# Patient Record
Sex: Female | Born: 1955 | Race: White | Hispanic: No | Marital: Single | Smoking: Former smoker
Health system: Southern US, Community
[De-identification: ages and names within clinical notes are randomized; demographics above are authoritative.]

## PROBLEM LIST (undated history)

## (undated) HISTORY — PX: HAND SURGERY: SHX662

---

## 2014-07-10 ENCOUNTER — Encounter (HOSPITAL_COMMUNITY): Payer: Self-pay | Admitting: Emergency Medicine

## 2014-07-10 ENCOUNTER — Emergency Department (HOSPITAL_COMMUNITY): Payer: BC Managed Care – PPO

## 2014-07-10 ENCOUNTER — Inpatient Hospital Stay (HOSPITAL_COMMUNITY)
Admission: EM | Admit: 2014-07-10 | Discharge: 2014-07-13 | DRG: 156 | Disposition: A | Discharge status: Home / Self Care | Payer: BC Managed Care – PPO | Attending: Otolaryngology | Admitting: Otolaryngology

## 2014-07-10 DIAGNOSIS — K113 Abscess of salivary gland: Secondary | ICD-10-CM

## 2014-07-10 DIAGNOSIS — K1121 Acute sialoadenitis: Secondary | ICD-10-CM | POA: Diagnosis present

## 2014-07-10 DIAGNOSIS — K1120 Sialoadenitis, unspecified: Principal | ICD-10-CM | POA: Diagnosis present

## 2014-07-10 DIAGNOSIS — Z87891 Personal history of nicotine dependence: Secondary | ICD-10-CM

## 2014-07-10 DIAGNOSIS — E860 Dehydration: Secondary | ICD-10-CM | POA: Diagnosis present

## 2014-07-10 LAB — BASIC METABOLIC PANEL
ANION GAP: 19 — AB (ref 5–15)
BUN: 17 mg/dL (ref 6–23)
CO2: 18 meq/L — AB (ref 19–32)
Calcium: 9.6 mg/dL (ref 8.4–10.5)
Chloride: 108 mEq/L (ref 96–112)
Creatinine, Ser: 1.03 mg/dL (ref 0.50–1.10)
GFR, EST AFRICAN AMERICAN: 68 mL/min — AB (ref >90)
GFR, EST NON AFRICAN AMERICAN: 59 mL/min — AB (ref >90)
Glucose, Bld: 109 mg/dL — ABNORMAL HIGH (ref 70–99)
Potassium: 4.2 mEq/L (ref 3.7–5.3)
SODIUM: 145 meq/L (ref 137–147)

## 2014-07-10 LAB — CBC WITH DIFFERENTIAL/PLATELET
BASOS ABS: 0 10*3/uL (ref 0.0–0.1)
Basophils Relative: 0 % (ref 0–1)
EOS PCT: 1 % (ref 0–5)
Eosinophils Absolute: 0.1 10*3/uL (ref 0.0–0.7)
HEMATOCRIT: 43.9 % (ref 36.0–46.0)
Hemoglobin: 14.5 g/dL (ref 12.0–15.0)
LYMPHS PCT: 11 % — AB (ref 12–46)
Lymphs Abs: 1.5 10*3/uL (ref 0.7–4.0)
MCH: 31.9 pg (ref 26.0–34.0)
MCHC: 33 g/dL (ref 30.0–36.0)
MCV: 96.7 fL (ref 78.0–100.0)
Monocytes Absolute: 1.1 10*3/uL — ABNORMAL HIGH (ref 0.1–1.0)
Monocytes Relative: 8 % (ref 3–12)
NEUTROS ABS: 11 10*3/uL — AB (ref 1.7–7.7)
Neutrophils Relative %: 80 % — ABNORMAL HIGH (ref 43–77)
PLATELETS: 307 10*3/uL (ref 150–400)
RBC: 4.54 MIL/uL (ref 3.87–5.11)
RDW: 14.2 % (ref 11.5–15.5)
WBC: 13.6 10*3/uL — AB (ref 4.0–10.5)

## 2014-07-10 MED ORDER — MORPHINE SULFATE 4 MG/ML IJ SOLN
4.0000 mg | Freq: Once | INTRAMUSCULAR | Status: AC
  Administered 2014-07-10: 4 mg via INTRAVENOUS
  Filled 2014-07-10: qty 1

## 2014-07-10 MED ORDER — SODIUM CHLORIDE 0.9 % IV BOLUS (SEPSIS)
1000.0000 mL | Freq: Once | INTRAVENOUS | Status: AC
  Administered 2014-07-10: 1000 mL via INTRAVENOUS

## 2014-07-10 MED ORDER — PROMETHAZINE HCL 25 MG PO TABS
12.5000 mg | ORAL_TABLET | Freq: Four times a day (QID) | ORAL | Status: DC | PRN
  Administered 2014-07-10 – 2014-07-13 (×9): 12.5 mg via ORAL
  Filled 2014-07-10 (×10): qty 1

## 2014-07-10 MED ORDER — CLINDAMYCIN PHOSPHATE 600 MG/50ML IV SOLN
600.0000 mg | Freq: Once | INTRAVENOUS | Status: AC
  Administered 2014-07-10: 600 mg via INTRAVENOUS
  Filled 2014-07-10: qty 50

## 2014-07-10 MED ORDER — CLINDAMYCIN PHOSPHATE 600 MG/50ML IV SOLN
600.0000 mg | Freq: Four times a day (QID) | INTRAVENOUS | Status: DC
  Administered 2014-07-10 – 2014-07-13 (×12): 600 mg via INTRAVENOUS
  Filled 2014-07-10 (×14): qty 50

## 2014-07-10 MED ORDER — IOHEXOL 300 MG/ML  SOLN
75.0000 mL | Freq: Once | INTRAMUSCULAR | Status: AC | PRN
  Administered 2014-07-10: 75 mL via INTRAVENOUS

## 2014-07-10 MED ORDER — POTASSIUM CHLORIDE IN NACL 20-0.45 MEQ/L-% IV SOLN
INTRAVENOUS | Status: DC
  Administered 2014-07-10 – 2014-07-13 (×4): via INTRAVENOUS
  Filled 2014-07-10 (×8): qty 1000

## 2014-07-10 MED ORDER — MORPHINE SULFATE 2 MG/ML IJ SOLN
2.0000 mg | INTRAMUSCULAR | Status: DC | PRN
  Administered 2014-07-10 – 2014-07-13 (×17): 2 mg via INTRAVENOUS
  Filled 2014-07-10 (×19): qty 1

## 2014-07-10 MED ORDER — HYDROCODONE-ACETAMINOPHEN 5-325 MG PO TABS
1.0000 | ORAL_TABLET | ORAL | Status: DC | PRN
  Administered 2014-07-10 – 2014-07-13 (×11): 2 via ORAL
  Filled 2014-07-10 (×11): qty 2

## 2014-07-10 NOTE — H&P (Signed)
Shelby Brock is an 58 y.o. female.   Chief Complaint: Parotid swelling HPI: Left parotid swelling started Tuesday evening. On Wednesday, she saw Dr. Wilburn Cornelia who started her on Augmentin. The swelling and pain have been fluctuating it got much worse this morning. She has not had fever. It has not fluctuated with meals. She has been staying well hydrated and has been taking her antibiotics.  History reviewed. No pertinent past medical history.  Past Surgical History  Procedure Laterality Date  . Hand surgery      History reviewed. No pertinent family history. Social History:  reports that she has quit smoking. She does not have any smokeless tobacco history on file. She reports that she drinks alcohol. She reports that she does not use illicit drugs.  Allergies: No Known Allergies   (Not in a hospital admission)  Results for orders placed during the hospital encounter of 07/10/14 (from the past 48 hour(s))  BASIC METABOLIC PANEL     Status: Abnormal   Collection Time    07/10/14  5:05 AM      Result Value Ref Range   Sodium 145  137 - 147 mEq/L   Potassium 4.2  3.7 - 5.3 mEq/L   Chloride 108  96 - 112 mEq/L   CO2 18 (*) 19 - 32 mEq/L   Glucose, Bld 109 (*) 70 - 99 mg/dL   BUN 17  6 - 23 mg/dL   Creatinine, Ser 1.03  0.50 - 1.10 mg/dL   Calcium 9.6  8.4 - 10.5 mg/dL   GFR calc non Af Amer 59 (*) >90 mL/min   GFR calc Af Amer 68 (*) >90 mL/min   Comment: (NOTE)     The eGFR has been calculated using the CKD EPI equation.     This calculation has not been validated in all clinical situations.     eGFR's persistently <90 mL/min signify possible Chronic Kidney     Disease.   Anion gap 19 (*) 5 - 15  CBC WITH DIFFERENTIAL     Status: Abnormal   Collection Time    07/10/14  5:05 AM      Result Value Ref Range   WBC 13.6 (*) 4.0 - 10.5 K/uL   RBC 4.54  3.87 - 5.11 MIL/uL   Hemoglobin 14.5  12.0 - 15.0 g/dL   HCT 43.9  36.0 - 46.0 %   MCV 96.7  78.0 - 100.0 fL   MCH 31.9   26.0 - 34.0 pg   MCHC 33.0  30.0 - 36.0 g/dL   RDW 14.2  11.5 - 15.5 %   Platelets 307  150 - 400 K/uL   Neutrophils Relative % 80 (*) 43 - 77 %   Neutro Abs 11.0 (*) 1.7 - 7.7 K/uL   Lymphocytes Relative 11 (*) 12 - 46 %   Lymphs Abs 1.5  0.7 - 4.0 K/uL   Monocytes Relative 8  3 - 12 %   Monocytes Absolute 1.1 (*) 0.1 - 1.0 K/uL   Eosinophils Relative 1  0 - 5 %   Eosinophils Absolute 0.1  0.0 - 0.7 K/uL   Basophils Relative 0  0 - 1 %   Basophils Absolute 0.0  0.0 - 0.1 K/uL   Ct Soft Tissue Neck W Contrast  07/10/2014   CLINICAL DATA:  Left jaw and neck swelling for 4 days.  EXAM: CT NECK WITH CONTRAST  TECHNIQUE: Multidetector CT imaging of the neck was performed using the standard protocol following  the bolus administration of intravenous contrast.  CONTRAST:  13mL OMNIPAQUE IOHEXOL 300 MG/ML  SOLN  COMPARISON:  None.  FINDINGS: The left parotid gland is enlarged, with a 1.9 x 2.2 x 3.3 cm (transverse by AP by CC) rim enhancing fluid collection within the superficial lobe with dependent enhancing versus dense contents. 2 mm sialolith along the inferior margin, axial 44/122. 3 mm left submandibular sialolith. Left parotid space inflammatory changes with trace effusion, slightly thickened left platysma. Remaining major salivary glands are unremarkable.  Aerodigestive tract is unremarkable. Normal appearance of the thyroid. Normal appearance of cervical vessels. No lymphadenopathy by CT size criteria.  Visualized paranasal sinuses and mastoid air cells are well aerated. Severe C4-5 and severe C6-7 disc degeneration with severe C6-7 neural foraminal narrowing, at least moderate to severe at C4-5.  IMPRESSION: 1.9 x 2.2 x 3.3 cm rim enhancing fluid collection in the left parotid gland, concerning for abscess including possibly a dilated duct and superimposed infection, with dependent purulent material versus enhancement. Findings less likely reflect necrotic mass. Associated 2 mm sialolith along the  inferior margin. Recommend close follow-up and, histopathologic correlation.  3 mm nonobstructing left submandibular sialolith.   Electronically Signed   By: Elon Alas   On: 07/10/2014 06:06    ROS: otherwise negative  Blood pressure 145/86, pulse 86, temperature 98 F (36.7 C), temperature source Oral, resp. rate 16, SpO2 100.00%.  PHYSICAL EXAM: Overall appearance:  Healthy appearing, in no distress, but in obvious discomfort. Head:  Normocephalic, atraumatic. Ears: External auditory ears are normal. Nose: External nose is healthy in appearance.  Oral Cavity/pharynx:  There are no mucosal lesions or masses identified. There is no swelling or tenderness along the buccal mucosa. Neuro:  No identifiable neurologic deficits. Facial nerve intact. Neck: No palpable neck masses, except for a swollen, indurated and somewhat tender left parotid gland. It is nonfluctuant.  Studies Reviewed: CT reviewed. Small fluid collection, possible early abscess. Tiny calculus but did not appear to be immediately adjacent to the infection.    Assessment/Plan Severe peritonitis, possibly  early abscess. Recommend admitted to the hospital for intravenous antibiotics and analgesics. Warm compresses, hydration. Monitor over the next 24-48 hours and consider surgical intervention if no improvement.  Shelby Brock 07/10/2014, 7:30 AM

## 2014-07-10 NOTE — ED Notes (Signed)
Pt to CT

## 2014-07-10 NOTE — ED Notes (Signed)
ENT MD at bedside.

## 2014-07-10 NOTE — ED Notes (Signed)
PA at bedside.

## 2014-07-10 NOTE — ED Notes (Signed)
Given warm blanket 

## 2014-07-10 NOTE — ED Notes (Signed)
Back from CT, alert, NAD, calm, "feeling better".

## 2014-07-10 NOTE — ED Provider Notes (Signed)
CSN: [id number]     Arrival date & time 07/10/14  0422 History   First MD Initiated Contact with Patient 07/10/14 [phone]     Chief Complaint  Patient presents with  . Facial Swelling      The history is provided by the patient.  Patient reports she has had left sided facial swelling for past 4 days Pain is worsening Worsened by palpation.  Nothing improves her pain No fever/vomiting.  No dental pain.  She reports it is hard to open her mouth.  No difficulty swallowing is reported She was seen by ENT Dr Annalee Genta who felt it was salivary gland stone, and was placed on augmentin and advised to suck on lemons Since she feels pain is worsened  PMH - none Past Surgical History  Procedure Laterality Date  . Hand surgery     History reviewed. No pertinent family history. History  Substance Use Topics  . Smoking status: Former Games developer  . Smokeless tobacco: Not on file  . Alcohol Use: Yes   OB History   Grav Para Term Preterm Abortions TAB SAB Ect Mult Living                 Review of Systems  Constitutional: Negative for fever.  HENT: Positive for facial swelling. Negative for trouble swallowing.   Respiratory: Negative for shortness of breath.   Cardiovascular: Negative for chest pain.  Neurological: Negative for weakness.  All other systems reviewed and are negative.     Allergies  Review of patient's allergies indicates no known allergies.  Home Medications   Prior to Admission medications   Medication Sig Start Date End Date Taking? Authorizing Provider  SUMAtriptan (IMITREX) 100 MG tablet Take 100 mg by mouth every 2 (two) hours as needed for migraine or headache. May repeat in 2 hours if headache persists or recurs.   Yes Historical Provider, MD  topiramate (TOPAMAX) 100 MG tablet Take 100 mg by mouth daily.   Yes Historical Provider, MD   BP 134/86  Pulse 103  Temp(Src) 98 F (36.7 C) (Oral)  Resp 16  SpO2 100% Physical Exam CONSTITUTIONAL: Well developed/well  nourished HEAD: Normocephalic/atraumatic EYES: EOMI/PERRL ENMT: Mucous membranes moist. Tenderness and swelling noted just below angle of left mandible.  No erythema or external abscess noted.  No dental tenderness or intra-oral abscess noted.  No significant trismus.  No stridor.  She is handling secretions well NECK: supple no meningeal signs CV: S1/S2 noted, no murmurs/rubs/gallops noted LUNGS: Lungs are clear to auscultation bilaterally, no apparent distress ABDOMEN: soft, nontender, no rebound or guarding NEURO: Pt is awake/alert, moves all extremitiesx4 EXTREMITIES: pulses normal, full ROM SKIN: warm, color normal PSYCH: no abnormalities of mood noted  ED Course  Procedures  5:31 AM Due to worsened pain will obtain CT imaging Pt agreeable 6:32 AM Discussed results with dr Pollyann Kennedy He gave patient option of operative management vs conservative management with pain meds/clindamycin Pt prefers to have operative management Dr Pollyann Kennedy to see patient in the ED Pt has been NPO while in ED.  Labs Review Labs Reviewed  BASIC METABOLIC PANEL - Abnormal; Notable for the following:    CO2 18 (*)    Glucose, Bld 109 (*)    GFR calc non Af Amer 59 (*)    GFR calc Af Amer 68 (*)    Anion gap 19 (*)    All other components within normal limits  CBC WITH DIFFERENTIAL - Abnormal; Notable for the following:  WBC 13.6 (*)    Neutrophils Relative % 80 (*)    Neutro Abs 11.0 (*)    Lymphocytes Relative 11 (*)    Monocytes Absolute 1.1 (*)    All other components within normal limits    Imaging Review Ct Soft Tissue Neck W Contrast  07/10/2014   CLINICAL DATA:  Left jaw and neck swelling for 4 days.  EXAM: CT NECK WITH CONTRAST  TECHNIQUE: Multidetector CT imaging of the neck was performed using the standard protocol following the bolus administration of intravenous contrast.  CONTRAST:  75mL OMNIPAQUE IOHEXOL 300 MG/ML  SOLN  COMPARISON:  None.  FINDINGS: The left parotid gland is enlarged,  with a 1.9 x 2.2 x 3.3 cm (transverse by AP by CC) rim enhancing fluid collection within the superficial lobe with dependent enhancing versus dense contents. 2 mm sialolith along the inferior margin, axial 44/122. 3 mm left submandibular sialolith. Left parotid space inflammatory changes with trace effusion, slightly thickened left platysma. Remaining major salivary glands are unremarkable.  Aerodigestive tract is unremarkable. Normal appearance of the thyroid. Normal appearance of cervical vessels. No lymphadenopathy by CT size criteria.  Visualized paranasal sinuses and mastoid air cells are well aerated. Severe C4-5 and severe C6-7 disc degeneration with severe C6-7 neural foraminal narrowing, at least moderate to severe at C4-5.  IMPRESSION: 1.9 x 2.2 x 3.3 cm rim enhancing fluid collection in the left parotid gland, concerning for abscess including possibly a dilated duct and superimposed infection, with dependent purulent material versus enhancement. Findings less likely reflect necrotic mass. Associated 2 mm sialolith along the inferior margin. Recommend close follow-up and, histopathologic correlation.  3 mm nonobstructing left submandibular sialolith.   Electronically Signed   By: Awilda Metroourtnay  Bloomer   On: 07/10/2014 06:06      MDM   Final diagnoses:  Abscess of parotid gland  Dehydration    Nursing notes including past medical history and social history reviewed and considered in documentation Labs/vital reviewed and considered     Joya Gaskinsonald W Kinzleigh Kandler, MD 07/10/14 [phone]

## 2014-07-10 NOTE — ED Notes (Signed)
Moderate swelling behind left jaw.  Saw Dr. Annalee GentaShoemaker and he prescribed pt. Augmenting and to stay really hydrated.  Suck on lemons to get salivary glands moving. ? Salivary gland stones.

## 2014-07-11 NOTE — Progress Notes (Signed)
Subjective: No significant improvement. She has been walking around and has been able to eat and drink.  Objective: Vital signs in last 24 hours: Temp:  [97.7 F (36.5 C)-98.9 F (37.2 C)] 98.9 F (37.2 C) (08/02 0510) Pulse Rate:  [75-87] 84 (08/02 0510) Resp:  [16-18] 17 (08/02 0510) BP: (123-150)/(74-88) 134/79 mmHg (08/02 0510) SpO2:  [95 %-100 %] 98 % (08/02 0510) Weight:  [165 lb (74.844 kg)] 165 lb (74.844 kg) (08/01 1200) Weight change:  Last BM Date: 07/11/14  Intake/Output from previous day: 08/01 0701 - 08/02 0700 In: 1492.5 [P.O.:360; I.V.:1132.5] Out: 1650 [Urine:1650] Intake/Output this shift:    PHYSICAL EXAM: No change in left parotid swelling. No skin erythema or fluctuance.  Lab Results:  Recent Labs  07/10/14 0505  WBC 13.6*  HGB 14.5  HCT 43.9  PLT 307   BMET  Recent Labs  07/10/14 0505  NA 145  K 4.2  CL 108  CO2 18*  GLUCOSE 109*  BUN 17  CREATININE 1.03  CALCIUM 9.6    Studies/Results: Ct Soft Tissue Neck W Contrast  07/10/2014   CLINICAL DATA:  Left jaw and neck swelling for 4 days.  EXAM: CT NECK WITH CONTRAST  TECHNIQUE: Multidetector CT imaging of the neck was performed using the standard protocol following the bolus administration of intravenous contrast.  CONTRAST:  75mL OMNIPAQUE IOHEXOL 300 MG/ML  SOLN  COMPARISON:  None.  FINDINGS: The left parotid gland is enlarged, with a 1.9 x 2.2 x 3.3 cm (transverse by AP by CC) rim enhancing fluid collection within the superficial lobe with dependent enhancing versus dense contents. 2 mm sialolith along the inferior margin, axial 44/122. 3 mm left submandibular sialolith. Left parotid space inflammatory changes with trace effusion, slightly thickened left platysma. Remaining major salivary glands are unremarkable.  Aerodigestive tract is unremarkable. Normal appearance of the thyroid. Normal appearance of cervical vessels. No lymphadenopathy by CT size criteria.  Visualized paranasal sinuses  and mastoid air cells are well aerated. Severe C4-5 and severe C6-7 disc degeneration with severe C6-7 neural foraminal narrowing, at least moderate to severe at C4-5.  IMPRESSION: 1.9 x 2.2 x 3.3 cm rim enhancing fluid collection in the left parotid gland, concerning for abscess including possibly a dilated duct and superimposed infection, with dependent purulent material versus enhancement. Findings less likely reflect necrotic mass. Associated 2 mm sialolith along the inferior margin. Recommend close follow-up and, histopathologic correlation.  3 mm nonobstructing left submandibular sialolith.   Electronically Signed   By: Awilda Metroourtnay  Bloomer   On: 07/10/2014 06:06    Medications: I have reviewed the patient's current medications.  Assessment/Plan: Stable, no worsening, no improvement. Allow one more day with IV antibiotics and then reevaluate for possible surgical drainage.  LOS: 1 day   Shelby Brock 07/11/2014, 9:50 AM

## 2014-07-12 ENCOUNTER — Inpatient Hospital Stay (HOSPITAL_COMMUNITY): Payer: BC Managed Care – PPO

## 2014-07-12 LAB — PROTIME-INR
INR: 1.08 (ref 0.00–1.49)
PROTHROMBIN TIME: 14 s (ref 11.6–15.2)

## 2014-07-12 MED ORDER — ONDANSETRON 8 MG/NS 50 ML IVPB
8.0000 mg | Freq: Three times a day (TID) | INTRAVENOUS | Status: DC | PRN
  Administered 2014-07-12: 8 mg via INTRAVENOUS
  Filled 2014-07-12: qty 8

## 2014-07-12 MED ORDER — MIDAZOLAM HCL 2 MG/2ML IJ SOLN
INTRAMUSCULAR | Status: AC | PRN
  Administered 2014-07-12: 1 mg via INTRAVENOUS

## 2014-07-12 MED ORDER — FENTANYL CITRATE 0.05 MG/ML IJ SOLN
INTRAMUSCULAR | Status: AC | PRN
  Administered 2014-07-12: 50 ug via INTRAVENOUS

## 2014-07-12 MED ORDER — MIDAZOLAM HCL 2 MG/2ML IJ SOLN
INTRAMUSCULAR | Status: AC
Start: 2014-07-12 — End: 2014-07-13
  Filled 2014-07-12: qty 2

## 2014-07-12 MED ORDER — FENTANYL CITRATE 0.05 MG/ML IJ SOLN
INTRAMUSCULAR | Status: AC
  Filled 2014-07-12: qty 2

## 2014-07-12 NOTE — Progress Notes (Signed)
Subjective: No better, no worse.  Objective: Vital signs in last 24 hours: Temp:  [98.2 F (36.8 C)-98.7 F (37.1 C)] 98.2 F (36.8 C) (08/03 0552) Pulse Rate:  [66-86] 66 (08/03 0552) Resp:  [16-17] 17 (08/03 0552) BP: (131-146)/(73-84) 131/73 mmHg (08/03 0552) SpO2:  [99 %-100 %] 100 % (08/03 0552) Weight change:  Last BM Date: 07/11/14  Intake/Output from previous day: 08/02 0701 - 08/03 0700 In: 1165 [P.O.:240; I.V.:675; IV Piggyback:250] Out: 1500 [Urine:1500] Intake/Output this shift:    PHYSICAL EXAM: Left parotid firm, tender, no significant change.  Lab Results:  Recent Labs  07/10/14 0505  WBC 13.6*  HGB 14.5  HCT 43.9  PLT 307   BMET  Recent Labs  07/10/14 0505  NA 145  K 4.2  CL 108  CO2 18*  GLUCOSE 109*  BUN 17  CREATININE 1.03  CALCIUM 9.6    Studies/Results: No results found.  Medications: I have reviewed the patient's current medications.  Assessment/Plan: Parotitis, possibly abscess. No improvement on IV antibiotics. In going to see if radiology to perform a needle aspirate using ultrasound guidance, to identify if there is pus, for sample for culture, and for decompression purposes. We are trying very hard to avoid an external scar, and potential facial nerve injury.  LOS: 2 days   Michall Noffke 07/12/2014, 9:25 AM

## 2014-07-12 NOTE — Procedures (Signed)
Interventional Radiology Procedure Note  Procedure: US guided aspiration LEFT parotid fluid Complications: None Recommendations: - Cultures pending  Signed,  Sterling BigHeath K. Omran Keelin, MD Vascular & Interventional Radiologist [facility]

## 2014-07-12 NOTE — Progress Notes (Signed)
Patient ID: Shelby Brock, female   DOB: 04-22-1956, 58 y.o.   MRN: [id number] Request received from ENT for US guided aspiration of left parotid fluid collection/? abscess in pt. Pt noted swelling/pain of left parotid last Tuesday and has been on oral antibiotics without sig change. CT showed 1.9 x 2.2 x 3.3 cm rim enhancing fluid collection in the left parotid gland, concerning for abscess including possibly a dilated  duct and superimposed infection, with dependent purulent material versus enhancement. Findings less likely reflect necrotic mass. Associated 2 mm sialolith along the inferior margin. Images were reviewed by Dr. Archer Asa. Exam: pt awake/alert; chest- CTA bilat; heart- RRR; abd- soft,+BS,NT; ext-FROM. Firm,fluctuant/indurated, tender left parotid gland.   Filed Vitals:   07/11/14 0510 07/11/14 1356 07/11/14 2207 07/12/14 0552  BP: 134/79 146/77 140/84 131/73  Pulse: 84 86 85 66  Temp: 98.9 F (37.2 C) 98.7 F (37.1 C) 98.5 F (36.9 C) 98.2 F (36.8 C)  TempSrc:  Oral Oral Oral  Resp: 17 17 16 17   Height:      Weight:      SpO2: 98% 99% 100% 100%   History reviewed. No pertinent past medical history. Past Surgical History  Procedure Laterality Date  . Hand surgery     Ct Soft Tissue Neck W Contrast  07/10/2014   CLINICAL DATA:  Left jaw and neck swelling for 4 days.  EXAM: CT NECK WITH CONTRAST  TECHNIQUE: Multidetector CT imaging of the neck was performed using the standard protocol following the bolus administration of intravenous contrast.  CONTRAST:  75mL OMNIPAQUE IOHEXOL 300 MG/ML  SOLN  COMPARISON:  None.  FINDINGS: The left parotid gland is enlarged, with a 1.9 x 2.2 x 3.3 cm (transverse by AP by CC) rim enhancing fluid collection within the superficial lobe with dependent enhancing versus dense contents. 2 mm sialolith along the inferior margin, axial 44/122. 3 mm left submandibular sialolith. Left parotid space inflammatory changes with trace effusion, slightly  thickened left platysma. Remaining major salivary glands are unremarkable.  Aerodigestive tract is unremarkable. Normal appearance of the thyroid. Normal appearance of cervical vessels. No lymphadenopathy by CT size criteria.  Visualized paranasal sinuses and mastoid air cells are well aerated. Severe C4-5 and severe C6-7 disc degeneration with severe C6-7 neural foraminal narrowing, at least moderate to severe at C4-5.  IMPRESSION: 1.9 x 2.2 x 3.3 cm rim enhancing fluid collection in the left parotid gland, concerning for abscess including possibly a dilated duct and superimposed infection, with dependent purulent material versus enhancement. Findings less likely reflect necrotic mass. Associated 2 mm sialolith along the inferior margin. Recommend close follow-up and, histopathologic correlation.  3 mm nonobstructing left submandibular sialolith.   Electronically Signed   By: Awilda Metro   On: 07/10/2014 06:06  Results for orders placed during the hospital encounter of 07/10/14  BASIC METABOLIC PANEL      Result Value Ref Range   Sodium 145  137 - 147 mEq/L   Potassium 4.2  3.7 - 5.3 mEq/L   Chloride 108  96 - 112 mEq/L   CO2 18 (*) 19 - 32 mEq/L   Glucose, Bld 109 (*) 70 - 99 mg/dL   BUN 17  6 - 23 mg/dL   Creatinine, Ser 4.09  0.50 - 1.10 mg/dL   Calcium 9.6  8.4 - 81.1 mg/dL   GFR calc non Af Amer 59 (*) >90 mL/min   GFR calc Af Amer 68 (*) >90 mL/min   Anion gap 19 (*)  5 - 15  CBC WITH DIFFERENTIAL      Result Value Ref Range   WBC 13.6 (*) 4.0 - 10.5 K/uL   RBC 4.54  3.87 - 5.11 MIL/uL   Hemoglobin 14.5  12.0 - 15.0 g/dL   HCT 62.943.9  52.836.0 - 41.346.0 %   MCV 96.7  78.0 - 100.0 fL   MCH 31.9  26.0 - 34.0 pg   MCHC 33.0  30.0 - 36.0 g/dL   RDW 24.414.2  01.011.5 - 27.215.5 %   Platelets 307  150 - 400 K/uL   Neutrophils Relative % 80 (*) 43 - 77 %   Neutro Abs 11.0 (*) 1.7 - 7.7 K/uL   Lymphocytes Relative 11 (*) 12 - 46 %   Lymphs Abs 1.5  0.7 - 4.0 K/uL   Monocytes Relative 8  3 - 12 %    Monocytes Absolute 1.1 (*) 0.1 - 1.0 K/uL   Eosinophils Relative 1  0 - 5 %   Eosinophils Absolute 0.1  0.0 - 0.7 K/uL   Basophils Relative 0  0 - 1 %   Basophils Absolute 0.0  0.0 - 0.1 K/uL   A/P: Pt with left parotitis/possible abscess, leukocytosis. Plan is for US guided left parotid gland fluid collection aspiration today. Details/risks of procedure d/w pt with her understanding and consent.

## 2014-07-13 MED ORDER — HYDROCODONE-ACETAMINOPHEN 7.5-325 MG PO TABS: 1.0000 | ORAL_TABLET | Freq: Four times a day (QID) | ORAL | Status: AC | PRN

## 2014-07-13 MED ORDER — CLINDAMYCIN HCL 300 MG PO CAPS: 300.0000 mg | ORAL_CAPSULE | Freq: Three times a day (TID) | ORAL | Status: AC

## 2014-07-13 MED ORDER — PROMETHAZINE HCL 25 MG RE SUPP: 25.0000 mg | Freq: Four times a day (QID) | RECTAL | Status: AC | PRN

## 2014-07-13 NOTE — Discharge Summary (Signed)
Physician Discharge Summary  Patient ID: Shelby Brock MRN: [id number] DOB/AGE: 59/12/1955 59 y.o.  Admit date: 07/10/2014 Discharge date: 07/13/2014  Admission Diagnoses: Parotid sialadenitis  Discharge Diagnoses:  Active Problems:   Parotitis, acute   Discharged Condition: good  Hospital Course: Minor improvement after IR aspiration.  Consults: Interventional radiology  Significant Diagnostic Studies: none  Treatments: procedures: Aspiration of parotid fluid by interventional radiology  Discharge Exam: Blood pressure 130/84, pulse 73, temperature 98.3 F (36.8 C), temperature source Oral, resp. rate 18, height 5\' 7"  (1.702 m), weight 165 lb (74.844 kg), SpO2 100.00%. PHYSICAL EXAM: Swelling left parotid, no fluctuance.  Disposition: Final discharge disposition not confirmed     Medication List         clindamycin 300 MG capsule  Commonly known as:  CLEOCIN  Take 1 capsule (300 mg total) by mouth 3 (three) times daily.     HYDROcodone-acetaminophen 7.5-325 MG per tablet  Commonly known as:  NORCO  Take 1 tablet by mouth every 6 (six) hours as needed for moderate pain.     promethazine 25 MG suppository  Commonly known as:  PHENERGAN  Place 1 suppository (25 mg total) rectally every 6 (six) hours as needed for nausea or vomiting.     SUMAtriptan 100 MG tablet  Commonly known as:  IMITREX  Take 100 mg by mouth every 2 (two) hours as needed for migraine or headache. May repeat in 2 hours if headache persists or recurs.     topiramate 100 MG tablet  Commonly known as:  TOPAMAX  Take 100 mg by mouth daily.           Follow-up Information   Follow up with Serena ColonelOSEN, Tracey Stewart, MD. Call on 07/15/2014.   Specialty:  Otolaryngology   Contact information:   [address] [place] [address] [phone]       Signed: Serena ColonelROSEN, Dayne Dekay 07/13/2014, 9:00 AM

## 2014-07-13 NOTE — Progress Notes (Signed)
Subjective: Feeling a little better since the aspiration was performed.  Objective: Vital signs in last 24 hours: Temp:  [98.1 F (36.7 C)-98.3 F (36.8 C)] 98.3 F (36.8 C) (08/04 0458) Pulse Rate:  [73-91] 73 (08/04 0458) Resp:  [17-18] 18 (08/04 0458) BP: (124-140)/(71-84) 130/84 mmHg (08/04 0458) SpO2:  [99 %-100 %] 100 % (08/04 0458) Weight change:  Last BM Date: 07/10/14  Intake/Output from previous day: 08/03 0701 - 08/04 0700 In: 3228.8 [P.O.:240; I.V.:2988.8] Out: 3 [Urine:3] Intake/Output this shift:    PHYSICAL EXAM: Induration and swelling with minor improvement.  Lab Results: No results found for this basename: WBC, HGB, HCT, PLT,  in the last 72 hours BMET No results found for this basename: NA, K, CL, CO2, GLUCOSE, BUN, CREATININE, CALCIUM,  in the last 72 hours  Studies/Results: [place] Aspiration  07/12/2014   Malachy MoanHeath McCullough, MD     07/12/2014  2:27 PM Interventional Radiology Procedure Note  Procedure: US guided aspiration LEFT parotid fluid Complications: None Recommendations: - Cultures pending  Signed,  Sterling BigHeath K. McCullough, MD Vascular & Interventional Radiologist [facility]     Medications: I have reviewed the patient's current medications.  Assessment/Plan: Slight improvement. Cultures negative to date. She seems to be well enough to go home and continue on oral clindamycin. We'll have her followup in the office in 2 days or sooner if she gets any worse.  LOS: 3 days   Leocadio Heal 07/13/2014, 8:57 AM

## 2014-07-13 NOTE — Discharge Summary (Signed)
Patient given discharge paperwork. Reviewed medications and prescriptions given to patient. Patient is familiar with MyChart, access given. Patient has no questions and is ready to be discharged.

## 2014-07-16 LAB — CULTURE, ROUTINE-ABSCESS: Culture: NO GROWTH

## 2015-06-01 IMAGING — CT CT NECK W/ CM
4 of 5 series · 15 of 33 positions shown, 17 images · IV contrast (Omni 300)
Comparison: None.

CLINICAL DATA: Left jaw and neck swelling for 4 days.

EXAM:
CT NECK WITH CONTRAST
TECHNIQUE: Multidetector CT imaging of the neck was performed using the
standard protocol following the bolus administration of intravenous
contrast.
CONTRAST:  75mL OMNIPAQUE IOHEXOL 300 MG/ML  SOLN

[Series 2: neck 2.0 i31s 3 · axial · 0.49mm/px · z∈[-294,-152]mm · 4 of 114 slices shown, 5 images]
[im 23/114  soft-tissue]
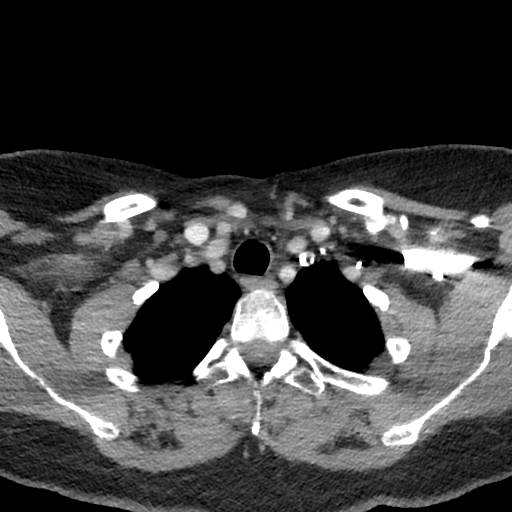
[im 23/114  bone]
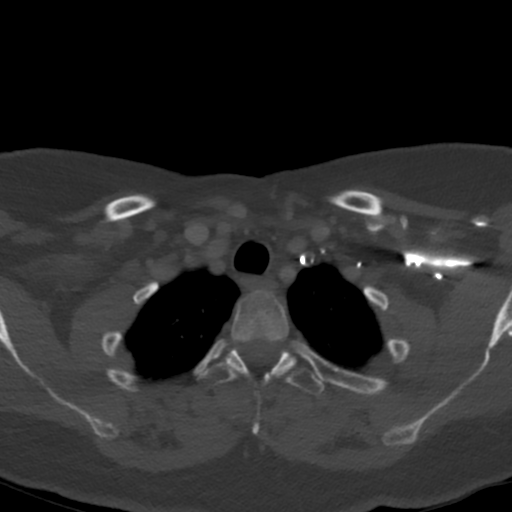
[im 46/114  bone]
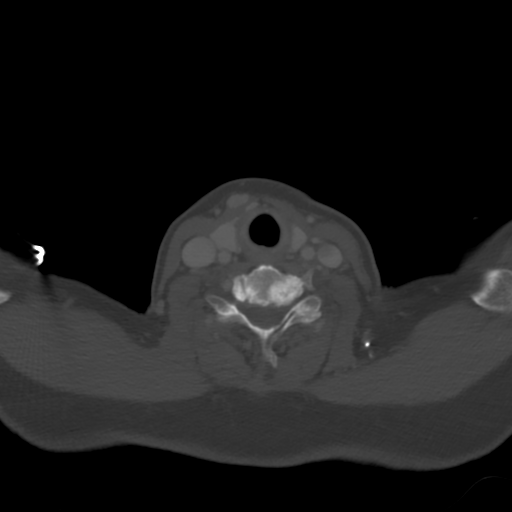
[im 68/114  bone]
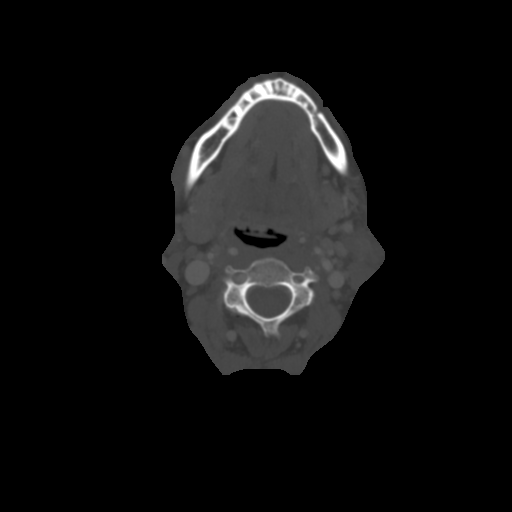
[im 91/114  bone]
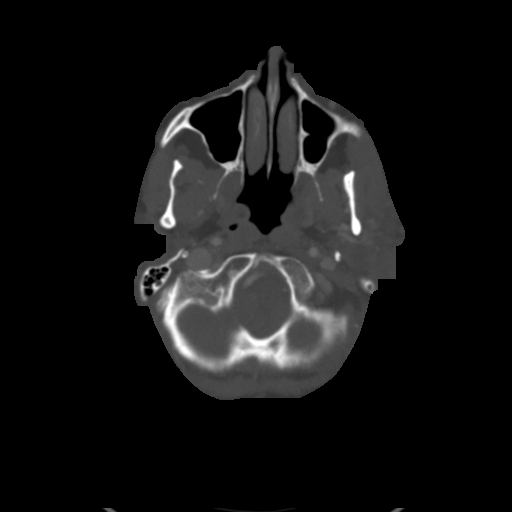

[Series 5: coronal st · coronal · 0.39mm/px · 3 of 100 slices shown]
[im 20/100  bone]
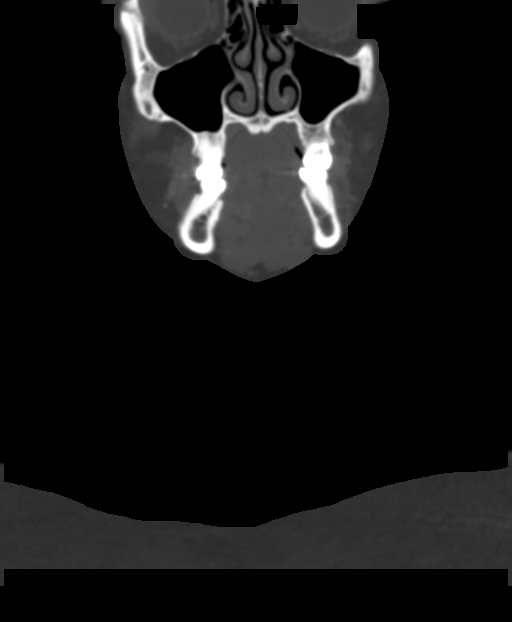
[im 40/100  bone]
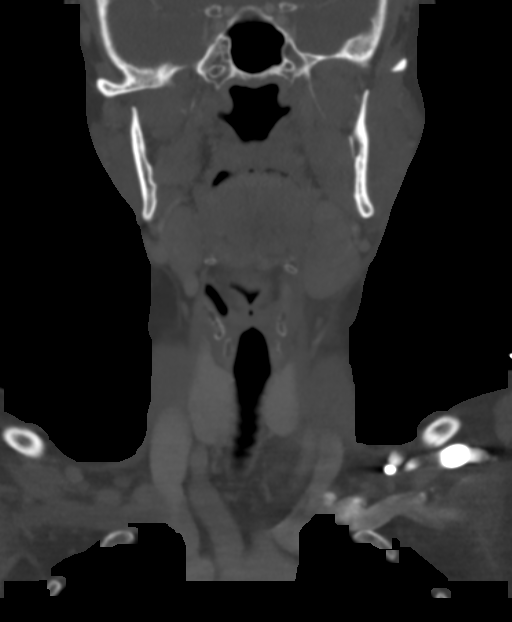
[im 60/100  bone]
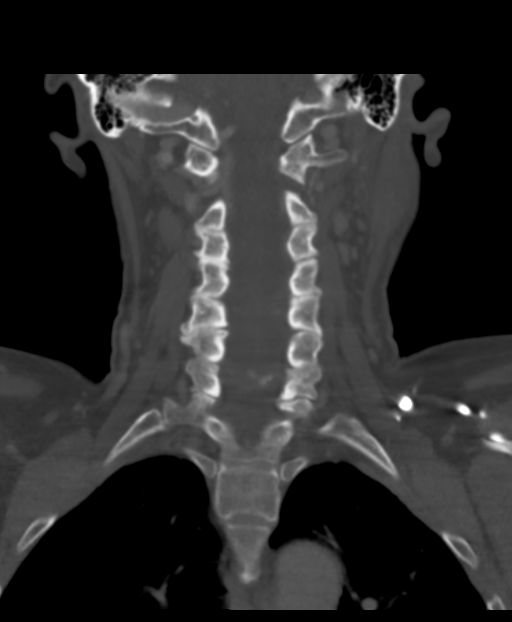

[Series 6: sagittal st · sagittal · 0.39mm/px · 5 of 73 slices shown, 6 images]
[im 25/73  bone]
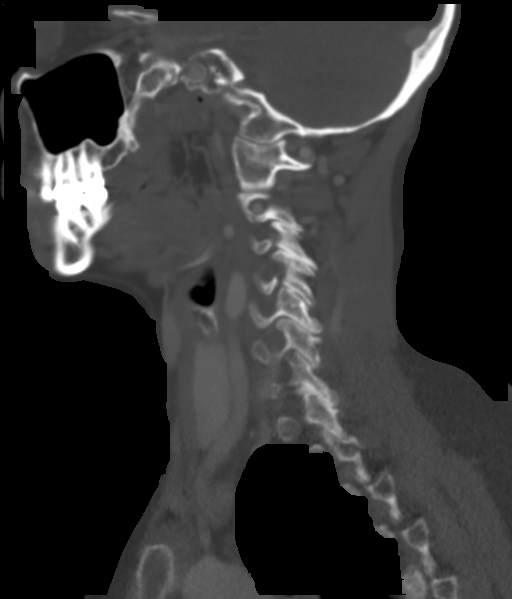
[im 31/73  bone]
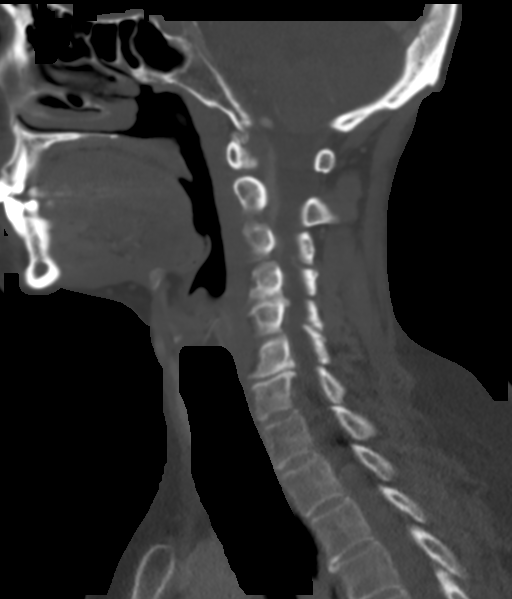
[im 37/73  soft-tissue]
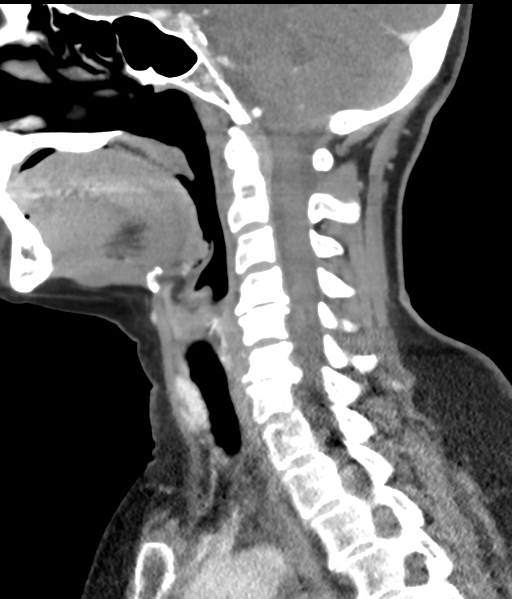
[im 37/73  bone]
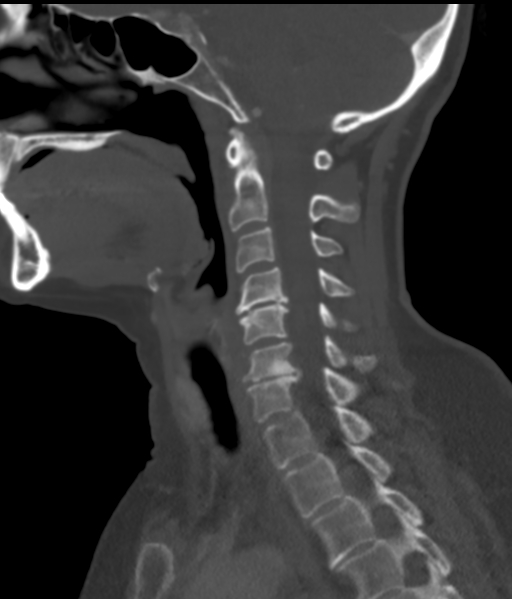
[im 43/73  bone]
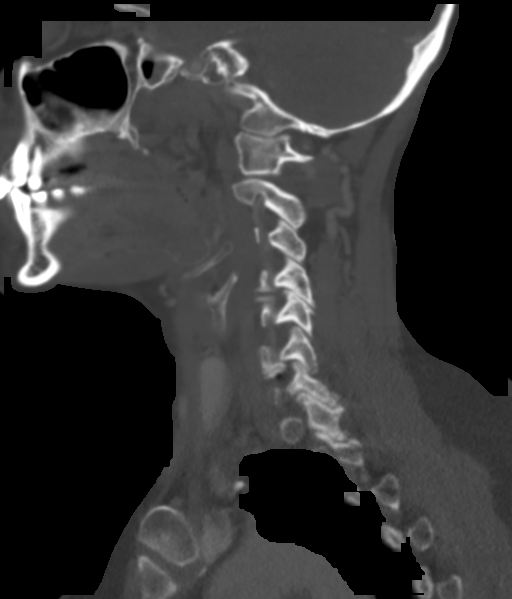
[im 49/73  bone]
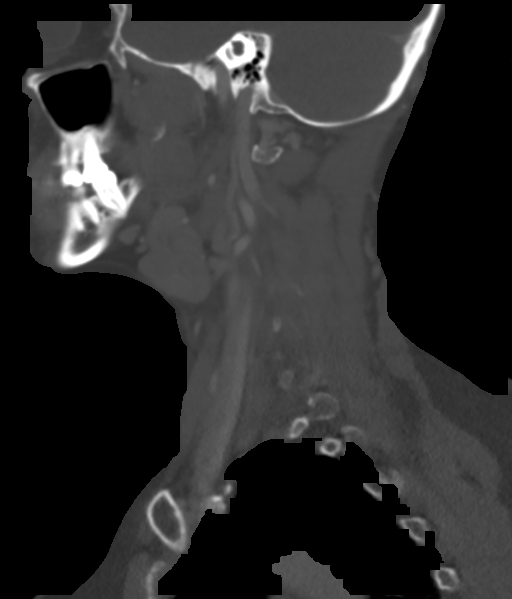

[Series 7: orthogonal st · axial · 0.39mm/px · z∈[-315,-221]mm · 3 of 122 slices shown]
[im 25/122  bone]
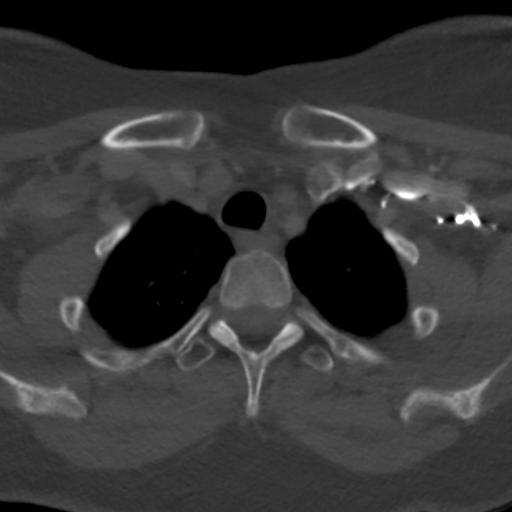
[im 49/122  bone]
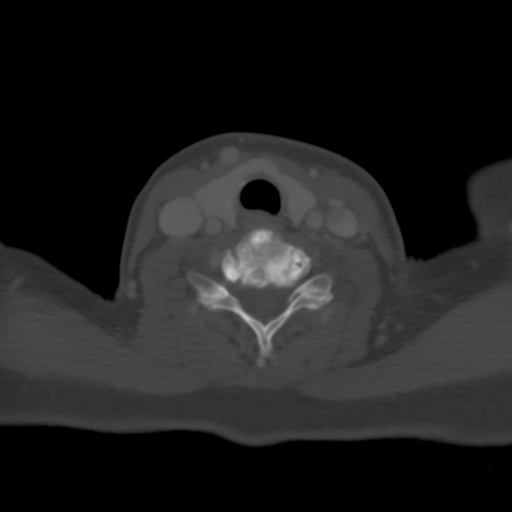
[im 73/122  bone]
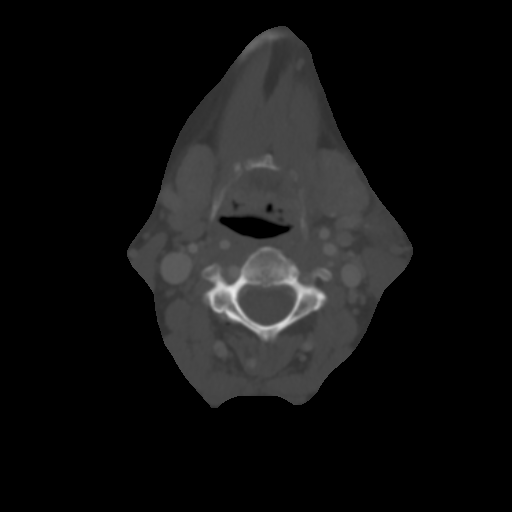

[15 of 33 positions shown; findings below may reference images not displayed]

FINDINGS: The left parotid gland is enlarged, with a 1.9 x 2.2 x 3.3 cm
(transverse by AP by CC) rim enhancing fluid collection within the
superficial lobe with dependent enhancing versus dense contents. 2
mm sialolith along the inferior margin, axial 44/122. 3 mm left
submandibular sialolith. Left parotid space inflammatory changes
with trace effusion, slightly thickened left platysma. Remaining
major salivary glands are unremarkable.

Aerodigestive tract is unremarkable. Normal appearance of the
thyroid. Normal appearance of cervical vessels. No lymphadenopathy
by CT size criteria.

Visualized paranasal sinuses and mastoid air cells are well aerated.
Severe C4-5 and severe C6-7 disc degeneration with severe C6-7
neural foraminal narrowing, at least moderate to severe at C4-5.
IMPRESSION: 1.9 x 2.2 x 3.3 cm rim enhancing fluid collection in the left
parotid gland, concerning for abscess including possibly a dilated
duct and superimposed infection, with dependent purulent material
versus enhancement. Findings less likely reflect necrotic mass.
Associated 2 mm sialolith along the inferior margin. Recommend close
follow-up and, histopathologic correlation.

3 mm nonobstructing left submandibular sialolith.

  By: Tin Wing Beracun

## 2015-06-03 IMAGING — US US ASPIRATION
1 series · 14 of 16 positions shown · non-contrast
Comparison: none

[Series 1: us aspiration · 0.06mm/px · 17 acquisitions, 14 frames shown]
[im 1/17]
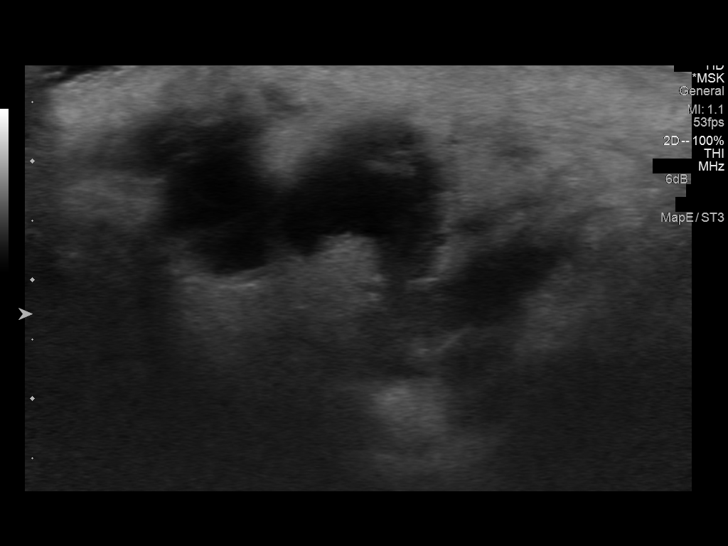
[im 2/17]
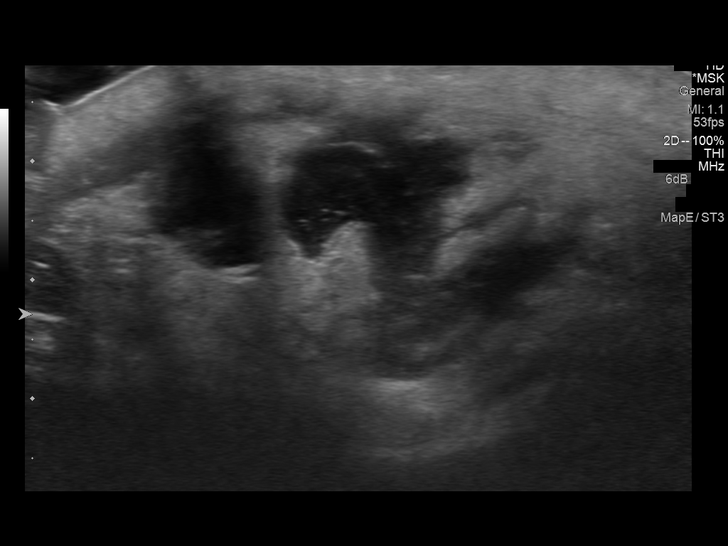
[im 3/17]
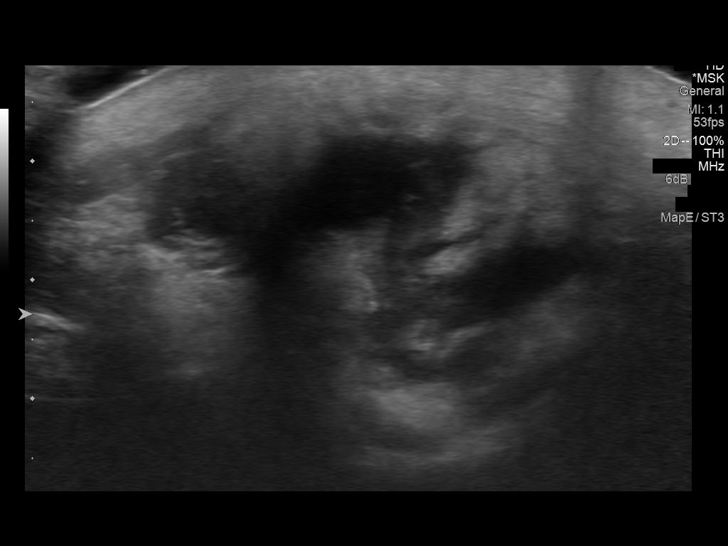
[im 5/17]
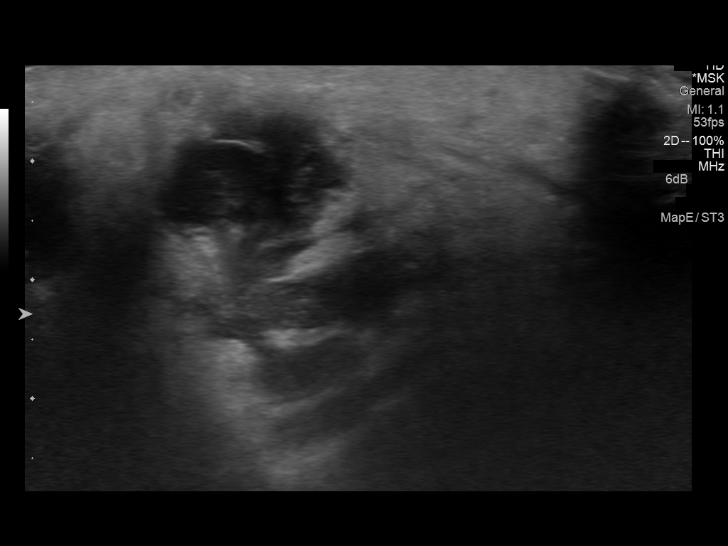
[im 6/17]
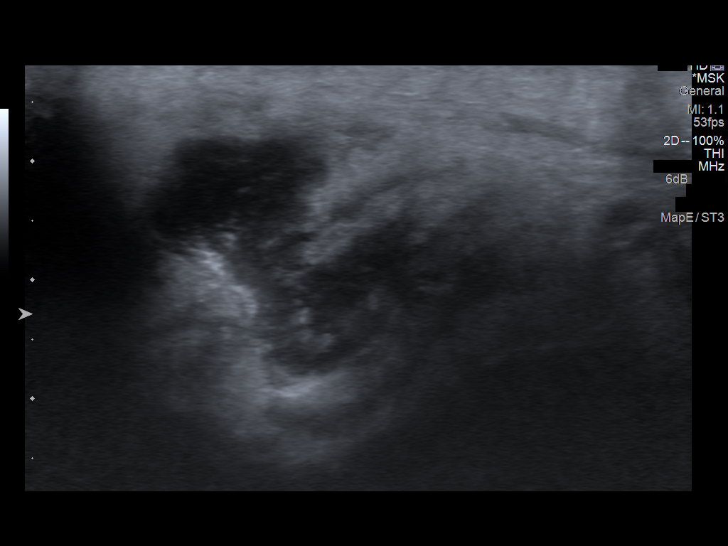
[im 7/17]
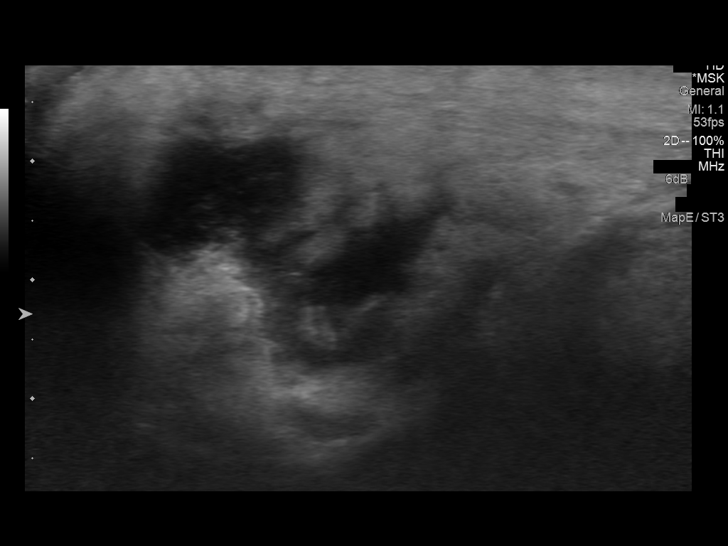
[im 8/17]
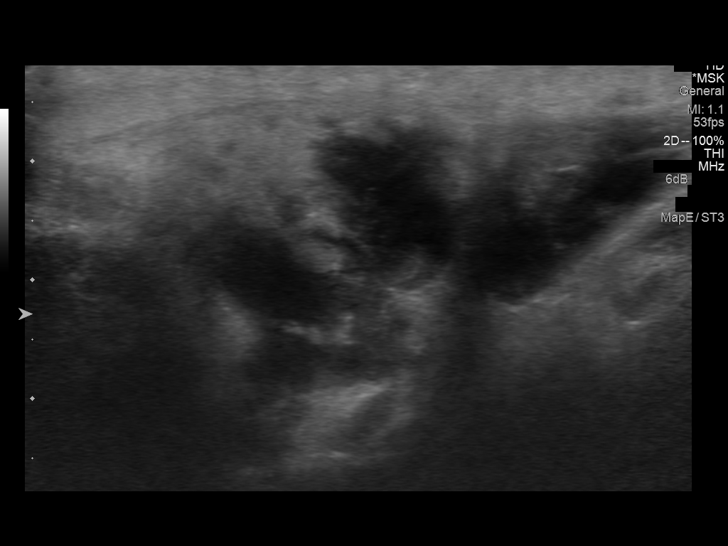
[im 9/17]
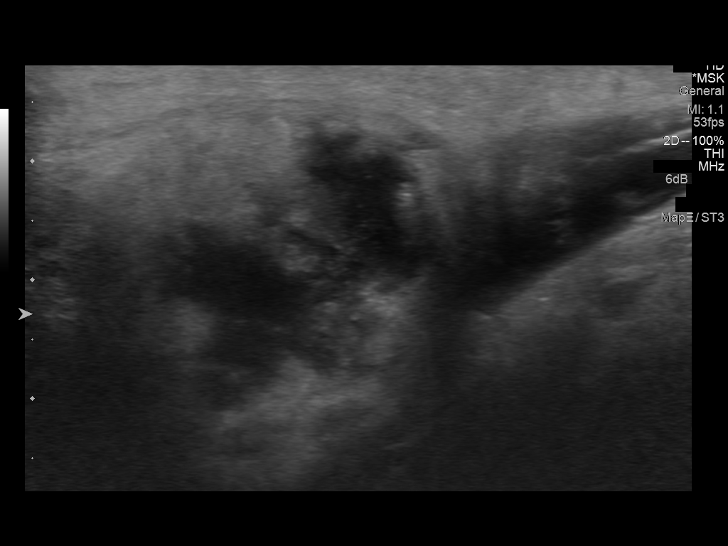
[im 10/17]
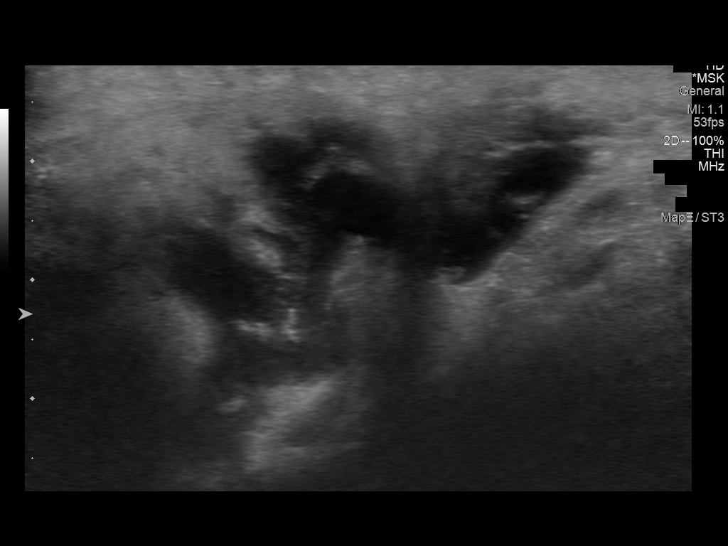
[im 11/17]
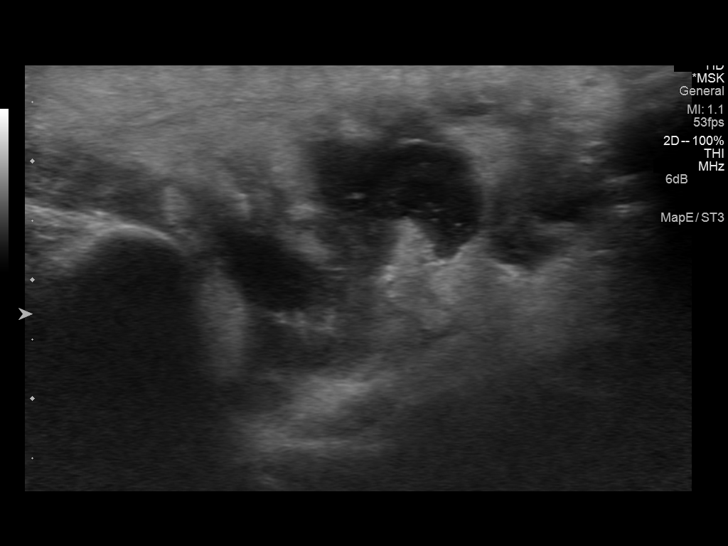
[im 13/17]
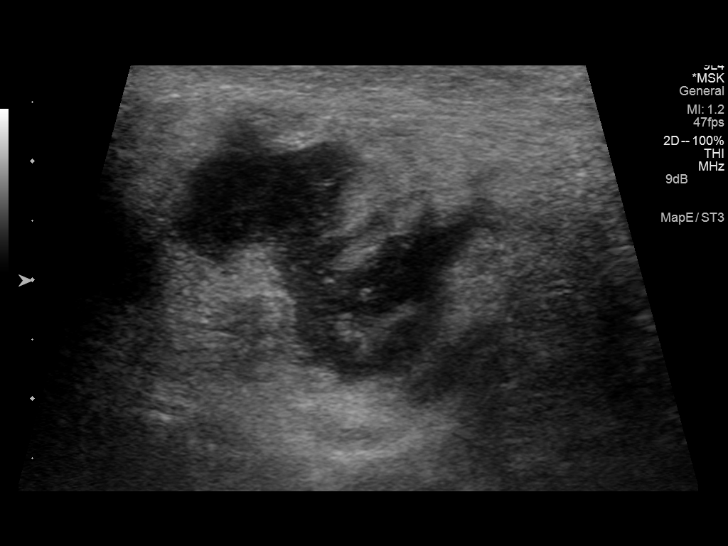
[im 14/17]
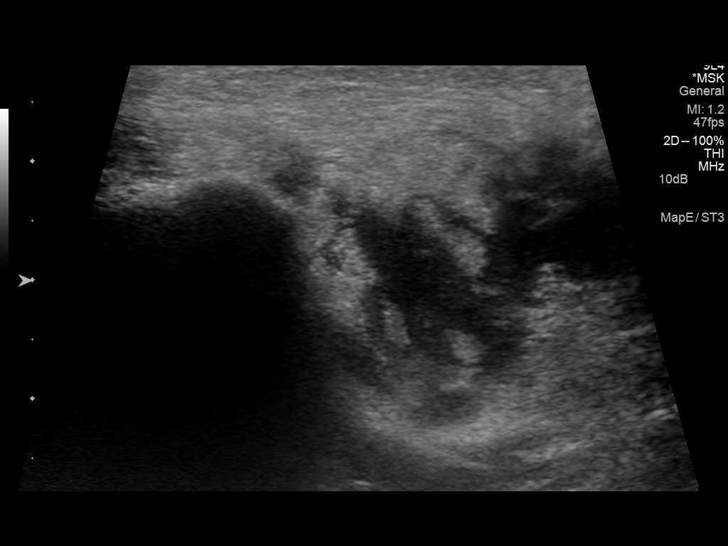
[im 15/17]
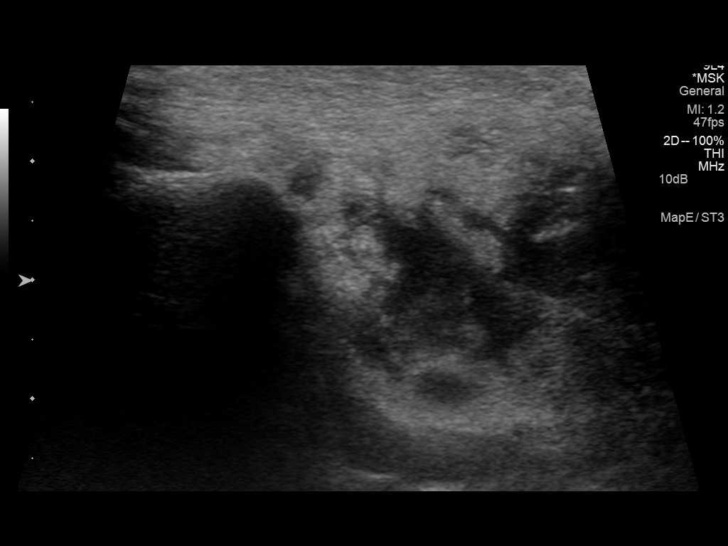
[im 17/17]
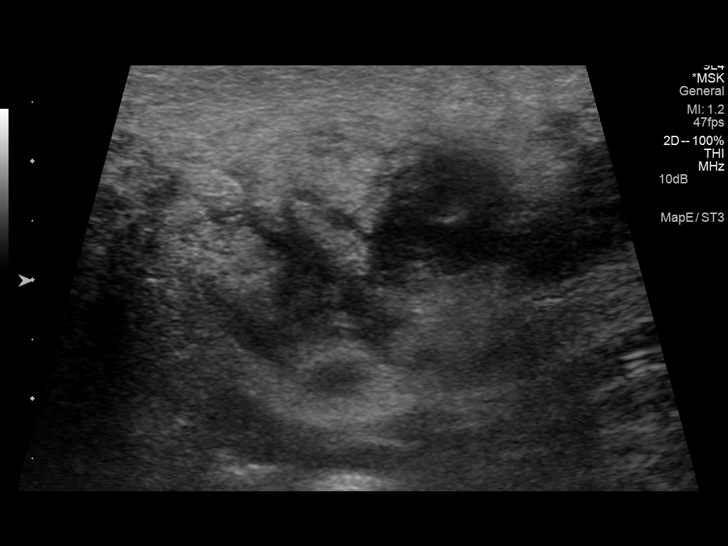

[14 of 16 positions shown; findings below may reference images not displayed]

Canned report from images found in remote index.

Refer to host system for actual result text.
